# Patient Record
Sex: Female | Born: 1965 | Race: White | Hispanic: No | Marital: Married | Smoking: Never smoker
Health system: Southern US, Community
[De-identification: ages and names within clinical notes are randomized; demographics above are authoritative.]

## PROBLEM LIST (undated history)

## (undated) DIAGNOSIS — Z803 Family history of malignant neoplasm of breast: Secondary | ICD-10-CM

## (undated) DIAGNOSIS — Z800 Family history of malignant neoplasm of digestive organs: Secondary | ICD-10-CM

## (undated) DIAGNOSIS — R928 Other abnormal and inconclusive findings on diagnostic imaging of breast: Secondary | ICD-10-CM

## (undated) DIAGNOSIS — Z889 Allergy status to unspecified drugs, medicaments and biological substances status: Secondary | ICD-10-CM

## (undated) DIAGNOSIS — Z9289 Personal history of other medical treatment: Secondary | ICD-10-CM

## (undated) DIAGNOSIS — Z8052 Family history of malignant neoplasm of bladder: Secondary | ICD-10-CM

## (undated) DIAGNOSIS — N63 Unspecified lump in unspecified breast: Secondary | ICD-10-CM

## (undated) HISTORY — DX: Family history of malignant neoplasm of digestive organs: Z80.0

## (undated) HISTORY — DX: Allergy status to unspecified drugs, medicaments and biological substances: Z88.9

## (undated) HISTORY — PX: DILATION AND CURETTAGE OF UTERUS: SHX78

## (undated) HISTORY — DX: Personal history of other medical treatment: Z92.89

## (undated) HISTORY — DX: Unspecified lump in unspecified breast: N63.0

## (undated) HISTORY — PX: KNEE SURGERY: SHX244

## (undated) HISTORY — DX: Other abnormal and inconclusive findings on diagnostic imaging of breast: R92.8

## (undated) HISTORY — DX: Family history of malignant neoplasm of bladder: Z80.52

## (undated) HISTORY — DX: Family history of malignant neoplasm of breast: Z80.3

---

## 2005-04-07 ENCOUNTER — Ambulatory Visit: Payer: Self-pay | Admitting: Unknown Physician Specialty

## 2005-06-10 ENCOUNTER — Emergency Department: Payer: Self-pay | Admitting: Emergency Medicine

## 2007-11-30 ENCOUNTER — Ambulatory Visit: Payer: Self-pay | Admitting: Unknown Physician Specialty

## 2008-12-04 ENCOUNTER — Ambulatory Visit: Payer: Self-pay | Admitting: Unknown Physician Specialty

## 2008-12-12 ENCOUNTER — Ambulatory Visit: Payer: Self-pay | Admitting: Unknown Physician Specialty

## 2009-03-12 ENCOUNTER — Ambulatory Visit: Payer: Self-pay | Admitting: Internal Medicine

## 2009-06-19 ENCOUNTER — Ambulatory Visit: Payer: Self-pay | Admitting: General Surgery

## 2010-01-01 ENCOUNTER — Ambulatory Visit: Payer: Self-pay | Admitting: General Surgery

## 2010-10-17 DIAGNOSIS — R928 Other abnormal and inconclusive findings on diagnostic imaging of breast: Secondary | ICD-10-CM

## 2010-10-17 HISTORY — DX: Other abnormal and inconclusive findings on diagnostic imaging of breast: R92.8

## 2011-01-07 ENCOUNTER — Ambulatory Visit: Payer: Self-pay | Admitting: Unknown Physician Specialty

## 2014-11-20 ENCOUNTER — Ambulatory Visit: Payer: Self-pay | Admitting: Nurse Practitioner

## 2014-11-28 ENCOUNTER — Ambulatory Visit: Payer: Self-pay | Admitting: Nurse Practitioner

## 2016-02-01 DIAGNOSIS — Z9289 Personal history of other medical treatment: Secondary | ICD-10-CM

## 2016-02-01 HISTORY — DX: Personal history of other medical treatment: Z92.89

## 2017-02-14 ENCOUNTER — Telehealth: Payer: Self-pay | Admitting: Obstetrics and Gynecology

## 2017-02-14 NOTE — Telephone Encounter (Signed)
Pt is calling to schedule appt. Pt is concerned about abnormal bleeding. Pt is schedule with Dr. Jean Rosenthal at the first available appt. Pt would like to know if she could be worked in and also would like an Engineer, civil (consulting) to call her back. Please advise

## 2017-02-16 NOTE — Telephone Encounter (Signed)
Lm with pt to call back. First available spot is fine. If pt is very worried about bleeding she should go to ER.

## 2017-02-21 ENCOUNTER — Telehealth: Payer: Self-pay

## 2017-02-21 NOTE — Telephone Encounter (Signed)
Pt called over a week ago and hasn't heard from anyone.  She is have extended periods - over 10d- she was just spotting now it is dripping out of her.  Pt VERY upset.

## 2017-03-06 NOTE — Telephone Encounter (Signed)
Called pt this AM to check on her with bleeding, has appt 03/09/2017 with SDJ. Pt states bleeding has gotten better but I advised pt to keep appt so we can try to find out what is going on. Pt is due for annual but aware that we will only be able to address this issue at this appt then will get her annual scheduled at first available spot. Pt very appreciaitive and will be in 5/24

## 2017-03-09 ENCOUNTER — Encounter: Payer: Self-pay | Admitting: Obstetrics and Gynecology

## 2017-03-09 ENCOUNTER — Ambulatory Visit (INDEPENDENT_AMBULATORY_CARE_PROVIDER_SITE_OTHER): Payer: BLUE CROSS/BLUE SHIELD | Admitting: Obstetrics and Gynecology

## 2017-03-09 VITALS — BP 118/72 | Ht 65.0 in | Wt 168.0 lb

## 2017-03-09 DIAGNOSIS — N920 Excessive and frequent menstruation with regular cycle: Secondary | ICD-10-CM | POA: Diagnosis not present

## 2017-03-09 LAB — HEMOCCULT GUIAC POC 1CARD (OFFICE): Fecal Occult Blood, POC: NEGATIVE

## 2017-03-09 LAB — POCT URINE PREGNANCY: Preg Test, Ur: NEGATIVE

## 2017-03-09 NOTE — Progress Notes (Signed)
Obstetrics & Gynecology Office Visit   Chief Complaint  Patient presents with  . Menstrual Problem  . Abnormal Uterine Bleeding   History of Present Illness: Kelly Morgan is a 51 y.o. [id number] who LMP was Patient's last menstrual period was 02/09/2017., presents today for a problem visit.  She complains of menorrhagia that  began several weeks ago and its severity is described as severe.  She has regular periods every 28 days and they are associated with mild menstrual cramping.  She has used the following for attempts at control: panty liner. The patient is sexually active. She currently uses Female sterilization: Past: yes for contraception.   Previous evaluation: none. Prior Diagnosis: n/a. Previous Treatment: none.  LMP: Patient's last menstrual period was 02/09/2017. Menarche:14 Heavy Menses: no Clots: no Intermenstrual Bleeding: no Postcoital Bleeding: no Dysmenorrhea: yes  Review of Systems: Review of Systems  Constitutional: Negative.   HENT: Negative.   Eyes: Negative.   Respiratory: Negative.   Cardiovascular: Negative.   Gastrointestinal: Negative.   Genitourinary: Negative.   Musculoskeletal: Negative.   Skin: Negative.   Neurological: Negative.   Psychiatric/Behavioral: Negative.     Past Medical History:  Diagnosis Date  . Abnormal mammogram 2012  . Breast mass in female    left  . Family history of bladder cancer   . Family history of breast cancer   . Family history of colon cancer   . H/O seasonal allergies   . History of mammogram 02/01/2016   BIRAD 1  . History of Papanicolaou smear of cervix 02/01/2016   Neg    Past Surgical History:  Procedure Laterality Date  . DILATION AND CURETTAGE OF UTERUS    . KNEE SURGERY      Gynecologic History: Patient's last menstrual period was 02/09/2017.  Obstetric History: [id number]  Family History  Problem Relation Age of Onset  . Bladder Cancer Father   . Heart disease Father   . Stroke Father   .  Diabetes Mother   . Heart disease Mother   . Hypertension Mother   . Hypertension Brother   . Colon cancer Maternal Grandmother   . Heart disease Maternal Grandmother   . Heart disease Maternal Grandfather   . Heart disease Paternal Grandfather   . Breast cancer Maternal Aunt [id number]    Social History   Social History  . Marital status: Married    Spouse name: N/A  . Number of children: N/A  . Years of education: N/A   Occupational History  . Not on file.   Social History Main Topics  . Smoking status: Never Smoker  . Smokeless tobacco: Never Used  . Alcohol use Yes  . Drug use: No  . Sexual activity: Yes    Birth control/ protection: None   Other Topics Concern  . Not on file   Social History Narrative  . No narrative on file    Allergies  Allergen Reactions  . Tape   . Vicodin [Hydrocodone-Acetaminophen]     Medications:   Medication Sig Start Date End Date Taking? Authorizing Provider  loratadine (CLARITIN) 10 MG tablet Take 10 mg by mouth daily.   Yes [provider]  ergocalciferol (VITAMIN D2) 50000 units capsule Take 50,000 Units by mouth once a week.    [provider]  triamcinolone (NASACORT ALLERGY 24HR) 55 MCG/ACT AERO nasal inhaler Place 2 sprays into the nose daily.    [provider]    Physical Exam BP 118/72   Ht 5\' 5"  (1.651  m)   Wt 168 lb (76.2 kg)   LMP 02/09/2017   BMI 27.96 kg/m  Patient's last menstrual period was 02/09/2017. Physical Exam  Constitutional: She is oriented to person, place, and time and well-developed, well-nourished, and in no distress. No distress.  HENT:  Head: Normocephalic and atraumatic.  Eyes: Conjunctivae are normal. Left eye exhibits no discharge. No scleral icterus.  Neck: Normal range of motion. Neck supple. No thyromegaly present.  Cardiovascular: Normal rate and regular rhythm.  Exam reveals no gallop and no friction rub.   No murmur heard. Pulmonary/Chest: Effort normal and  breath sounds normal. No respiratory distress. She has no wheezes. She has no rales.  Abdominal: Soft. Bowel sounds are normal. She exhibits no distension and no mass. There is no tenderness. There is no rebound and no guarding.  Genitourinary: Vagina normal, uterus normal, cervix normal, right adnexa normal, left adnexa normal and vulva normal. Rectal exam shows no external hemorrhoid, no fissure, no mass and guaiac negative stool.  Musculoskeletal: Normal range of motion. She exhibits no edema.  Lymphadenopathy:    She has no cervical adenopathy.  Neurological: She is alert and oriented to person, place, and time. No cranial nerve deficit.  Skin: Skin is warm and dry. No rash noted.  Psychiatric: Judgment normal.    Female chaperone present for pelvic and breast  portions of the physical exam  Fecal occult blood test: negative Urine pregnancy test: negative  Assessment: 51 y.o. [id number] here for menorrhagia with regular cycle (abnormal uterine bleeding).  Plan: Discussed differential diagnosis, including a one-off due to environmental factors. Discussed menopausal transition. Discussed structural and non-structural potential etiologies.  Will Obtain pelvic ultrasound as starting point. Consider endometrial biopsy depending on findings on ultrasound and may perform further workup, in absence of structural explanation.    Thomasene Mohair, MD 03/09/2017 5:52 PM

## 2017-03-24 ENCOUNTER — Ambulatory Visit: Payer: BLUE CROSS/BLUE SHIELD | Admitting: Obstetrics and Gynecology

## 2017-03-24 ENCOUNTER — Other Ambulatory Visit: Payer: BLUE CROSS/BLUE SHIELD

## 2017-04-04 ENCOUNTER — Other Ambulatory Visit: Payer: BLUE CROSS/BLUE SHIELD

## 2017-04-04 ENCOUNTER — Ambulatory Visit: Payer: BLUE CROSS/BLUE SHIELD | Admitting: Obstetrics and Gynecology

## 2017-04-06 ENCOUNTER — Other Ambulatory Visit: Payer: Self-pay | Admitting: Obstetrics and Gynecology

## 2017-04-06 ENCOUNTER — Telehealth: Payer: Self-pay | Admitting: Obstetrics and Gynecology

## 2017-04-06 DIAGNOSIS — N920 Excessive and frequent menstruation with regular cycle: Secondary | ICD-10-CM

## 2017-04-06 NOTE — Telephone Encounter (Signed)
Pt is schedule 04/20/17. °

## 2017-04-06 NOTE — Telephone Encounter (Signed)
Pt is calling to be reschedule for TVUS . Pt appt has been cancelled twice the first time was due to no tech being in the off. Would you please sign off on new orders for TVUS so patient could have her ultrasound.

## 2017-04-20 ENCOUNTER — Encounter: Payer: Self-pay | Admitting: Obstetrics and Gynecology

## 2017-04-20 ENCOUNTER — Ambulatory Visit (INDEPENDENT_AMBULATORY_CARE_PROVIDER_SITE_OTHER): Payer: BLUE CROSS/BLUE SHIELD

## 2017-04-20 ENCOUNTER — Ambulatory Visit (INDEPENDENT_AMBULATORY_CARE_PROVIDER_SITE_OTHER): Payer: BLUE CROSS/BLUE SHIELD | Admitting: Obstetrics and Gynecology

## 2017-04-20 VITALS — BP 120/80 | HR 64 | Ht 65.0 in | Wt 172.0 lb

## 2017-04-20 DIAGNOSIS — N920 Excessive and frequent menstruation with regular cycle: Secondary | ICD-10-CM

## 2017-04-20 NOTE — Progress Notes (Signed)
   Gynecology Ultrasound Follow Up   Chief Complaint  Patient presents with  . Follow-up    past 2 periods have been normal   History of Present Illness: Patient is a 51 y.o. female who presents today for ultrasound evaluation of abnormal uterine bleeding .  Ultrasound demonstrates the following findgins Adnexa: no masses seen  Uterus: retroflexed with endometrial stripe  6.0 mm Additional: no other findings  Patient has had two normal menses since she saw me last.  No other complaints  Review of Systems: ROS - negative  Physical Exam BP 120/80   Pulse 64   Ht 5\' 5"  (1.651 m)   Wt 172 lb (78 kg)   LMP 04/05/2017   BMI 28.62 kg/m   General: NAD HEENT: normocephalic, anicteric Pulmonary: No increased work of breathing Extremities: no edema, erythema, or tenderness Neurologic: Grossly intact, normal gait Psychiatric: mood appropriate, affect full  Assessment: 51 y.o. [id number] menorrhagia with regular cycle.  Appears to have resolved.  Plan: Problem List Items Addressed This Visit    Menorrhagia with regular cycle - Primary     Follow up for routine annual examination in 1-2 months  Thomasene MohairStephen Mica Releford, MD 04/20/2017 5:40 PM

## 2018-06-01 ENCOUNTER — Other Ambulatory Visit (HOSPITAL_COMMUNITY)
Admission: RE | Admit: 2018-06-01 | Discharge: 2018-06-01 | Disposition: A | Discharge status: Home / Self Care | Payer: BLUE CROSS/BLUE SHIELD | Source: Ambulatory Visit | Attending: Obstetrics and Gynecology | Admitting: Obstetrics and Gynecology

## 2018-06-01 ENCOUNTER — Ambulatory Visit (INDEPENDENT_AMBULATORY_CARE_PROVIDER_SITE_OTHER): Payer: BLUE CROSS/BLUE SHIELD | Admitting: Obstetrics and Gynecology

## 2018-06-01 ENCOUNTER — Encounter: Payer: Self-pay | Admitting: Obstetrics and Gynecology

## 2018-06-01 VITALS — BP 118/80 | HR 65 | Ht 65.0 in | Wt 174.0 lb

## 2018-06-01 DIAGNOSIS — Z01419 Encounter for gynecological examination (general) (routine) without abnormal findings: Secondary | ICD-10-CM | POA: Insufficient documentation

## 2018-06-01 DIAGNOSIS — Z124 Encounter for screening for malignant neoplasm of cervix: Secondary | ICD-10-CM

## 2018-06-01 DIAGNOSIS — Z1321 Encounter for screening for nutritional disorder: Secondary | ICD-10-CM

## 2018-06-01 DIAGNOSIS — Z1339 Encounter for screening examination for other mental health and behavioral disorders: Secondary | ICD-10-CM

## 2018-06-01 DIAGNOSIS — Z131 Encounter for screening for diabetes mellitus: Secondary | ICD-10-CM

## 2018-06-01 DIAGNOSIS — Z1329 Encounter for screening for other suspected endocrine disorder: Secondary | ICD-10-CM

## 2018-06-01 DIAGNOSIS — Z1331 Encounter for screening for depression: Secondary | ICD-10-CM | POA: Diagnosis not present

## 2018-06-01 DIAGNOSIS — Z0000 Encounter for general adult medical examination without abnormal findings: Secondary | ICD-10-CM

## 2018-06-01 DIAGNOSIS — Z13220 Encounter for screening for lipoid disorders: Secondary | ICD-10-CM

## 2018-06-01 LAB — HM PAP SMEAR: HM Pap smear: NORMAL

## 2018-06-01 NOTE — Progress Notes (Signed)
Ggynecology Annual Exam  PCP: Patient, No Pcp Per  Chief Complaint  Patient presents with  . Annual Exam   History of Present Illness:  Ms. Kelly Morgan is a 52 y.o. [id number] who LMP was Patient's last menstrual period was 04/07/2018., presents today for her annual examination.  Her menses have been regular since her last visit. However, she has not had a menses in about 2 months.    She does have vasomotor symptoms. She has hot flushes. She has gained some weight.   She is single partner, contraception - vasectomy. She does not have vaginal dryness. Does not have pain with intercourse.   Last Pap: 2 years (plain pap)  Results were: no abnormalities, HPV DNA.not done Hx of STDs: distant history of genital warts.  Last mammogram: 2 years  Results were: normal--routine follow-up in 12 months There is a FH of breast cancer in her maternal aunt. There is no FH of ovarian cancer. The patient does not do self-breast exams.  Colonoscopy: not yet due.  DEXA: has not been screened for osteoporosis  Tobacco use: The patient denies current or previous tobacco use. Alcohol use: social drinker Exercise: mild exercise  The patient wears seatbelts: yes.   She feels safe at home without concerns for abuse.  Past Medical History:  Diagnosis Date  . Abnormal mammogram 2012  . Breast mass in female    left  . Family history of bladder cancer   . Family history of breast cancer   . Family history of colon cancer   . H/O seasonal allergies   . History of mammogram 02/01/2016   BIRAD 1  . History of Papanicolaou smear of cervix 02/01/2016   Neg    Past Surgical History:  Procedure Laterality Date  . DILATION AND CURETTAGE OF UTERUS    . KNEE SURGERY      Prior to Admission medications   Medication Sig Start Date End Date Taking? Authorizing Provider  loratadine (CLARITIN) 10 MG tablet Take 10 mg by mouth daily.   Yes [provider]  triamcinolone (NASACORT ALLERGY  24HR) 55 MCG/ACT AERO nasal inhaler Place 2 sprays into the nose as needed.    Yes [provider]  ergocalciferol (VITAMIN D2) 50000 units capsule Take 50,000 Units by mouth once a week.    [provider]   Allergies  Allergen Reactions  . Tape   . Vicodin [Hydrocodone-Acetaminophen]    Obstetric History: [id number]  Family History  Problem Relation Age of Onset  . Bladder Cancer Father   . Heart disease Father   . Stroke Father   . Diabetes Mother   . Heart disease Mother   . Hypertension Mother   . Hypertension Brother   . Colon cancer Maternal Grandmother   . Heart disease Maternal Grandmother   . Heart disease Maternal Grandfather   . Heart disease Paternal Grandfather   . Breast cancer Maternal Aunt [id number]    Social History   Socioeconomic History  . Marital status: Married    Spouse name: Not on file  . Number of children: Not on file  . Years of education: Not on file  . Highest education level: Not on file  Occupational History  . Not on file  Social Needs  . Financial resource strain: Not on file  . Food insecurity:    Worry: Not on file    Inability: Not on file  . Transportation needs:    Medical: Not on file  Non-medical: Not on file  Tobacco Use  . Smoking status: Never Smoker  . Smokeless tobacco: Never Used  Substance and Sexual Activity  . Alcohol use: Yes  . Drug use: No  . Sexual activity: Yes    Birth control/protection: None  Lifestyle  . Physical activity:    Days per week: Not on file    Minutes per session: Not on file  . Stress: Not on file  Relationships  . Social connections:    Talks on phone: Not on file    Gets together: Not on file    Attends religious service: Not on file    Active member of club or organization: Not on file    Attends meetings of clubs or organizations: Not on file    Relationship status: Not on file  . Intimate partner violence:    Fear of current or ex partner: Not on file     Emotionally abused: Not on file    Physically abused: Not on file    Forced sexual activity: Not on file  Other Topics Concern  . Not on file  Social History Narrative  . Not on file    Review of Systems  Constitutional: Negative.   HENT: Negative.   Eyes: Negative.   Respiratory: Negative.   Cardiovascular: Negative.   Gastrointestinal: Negative.   Genitourinary: Negative.   Musculoskeletal: Negative.   Skin: Negative.   Neurological: Negative.   Psychiatric/Behavioral: Negative.      Physical Exam BP 118/80 (BP Location: Left Arm, Patient Position: Sitting, Cuff Size: Normal)   Pulse 65   Ht 5\' 5"  (1.651 m)   Wt 174 lb (78.9 kg)   LMP 04/07/2018   SpO2 99%   BMI 28.96 kg/m   Physical Exam  Constitutional: She is oriented to person, place, and time. She appears well-developed and well-nourished. No distress.  Genitourinary: Uterus normal. Pelvic exam was performed with patient supine. There is no rash, tenderness, lesion or injury on the right labia. There is no rash, tenderness, lesion or injury on the left labia. No erythema, tenderness or bleeding in the vagina. No signs of injury around the vagina. No vaginal discharge found. Right adnexum does not display mass, does not display tenderness and does not display fullness. Left adnexum does not display mass, does not display tenderness and does not display fullness. Cervix does not exhibit motion tenderness, lesion, discharge or polyp.   Uterus is mobile and anteverted. Uterus is not enlarged, tender or exhibiting a mass.  HENT:  Head: Normocephalic and atraumatic.  Eyes: EOM are normal. No scleral icterus.  Neck: Normal range of motion. Neck supple. No thyromegaly present.  Cardiovascular: Normal rate and regular rhythm. Exam reveals no gallop and no friction rub.  No murmur heard. Pulmonary/Chest: Effort normal and breath sounds normal. No respiratory distress. She has no wheezes. She has no rales. Right breast exhibits  no inverted nipple, no mass, no nipple discharge, no skin change and no tenderness. Left breast exhibits no inverted nipple, no mass, no nipple discharge, no skin change and no tenderness.  Abdominal: Soft. Bowel sounds are normal. She exhibits no distension and no mass. There is no tenderness. There is no rebound and no guarding.  Musculoskeletal: Normal range of motion. She exhibits no edema or tenderness.  Lymphadenopathy:    She has no cervical adenopathy.       Right: No inguinal adenopathy present.       Left: No inguinal adenopathy present.  Neurological: She  is alert and oriented to person, place, and time. No cranial nerve deficit.  Skin: Skin is warm and dry. No rash noted. No erythema.  Psychiatric: She has a normal mood and affect. Her behavior is normal. Judgment normal.    Female chaperone present for pelvic and breast  portions of the physical exam  Results: AUDIT Questionnaire (screen for alcoholism): 5  Depression screen [facility] 2/9 06/01/2018  Decreased Interest 0  Down, Depressed, Hopeless 0  PHQ - 2 Score 0  Altered sleeping 1  Tired, decreased energy 1  Change in appetite 0  Feeling bad or failure about yourself  1  Trouble concentrating 0  Moving slowly or fidgety/restless 0  Suicidal thoughts 0  PHQ-9 Score 3  Difficult doing work/chores Not difficult at all   Assessment: 52 y.o. [id number] female here for routine gynecologic examination.  Plan: Problem List Items Addressed This Visit    None    Visit Diagnoses    Women's annual routine gynecological examination    -  Primary   Relevant Orders   Cytology - PAP   Hgb A1c w/o eAG   TSH + free T4   VITAMIN D 25 Hydroxy (Vit-D Deficiency, Fractures)   Lipid Panel With LDL/HDL Ratio   CBC   Comprehensive metabolic panel   Screening for depression       Screening for alcoholism       Pap smear for cervical cancer screening       Relevant Orders   Cytology - PAP   Screening cholesterol level       Relevant  Orders   Lipid Panel With LDL/HDL Ratio   Screening for thyroid disorder       Relevant Orders   TSH + free T4   Encounter for vitamin deficiency screening       Relevant Orders   VITAMIN D 25 Hydroxy (Vit-D Deficiency, Fractures)   Screening for diabetes mellitus       Relevant Orders   Hgb A1c w/o eAG   Laboratory examination ordered as part of a routine general medical examination       Relevant Orders   Hgb A1c w/o eAG   TSH + free T4   VITAMIN D 25 Hydroxy (Vit-D Deficiency, Fractures)   Lipid Panel With LDL/HDL Ratio   CBC   Comprehensive metabolic panel      Screening: -- Blood pressure screen normal -- Colonoscopy - not due -- Mammogram - due. Patient to call [place] to arrange. She understands that it is her responsibility to arrange this. -- Weight screening: normal -- Depression screening negative (PHQ-9) -- Nutrition: normal -- cholesterol screening: will obtain -- osteoporosis screening: not due -- tobacco screening: not using -- alcohol screening: AUDIT questionnaire indicates low-risk usage. -- family history of breast cancer screening: done. not at high risk. -- no evidence of domestic violence or intimate partner violence. -- STD screening: gonorrhea/chlamydia NAAT not collected per patient request. -- pap smear collected per ASCCP guidelines  Thomasene Mohair, MD 06/01/2018 1:35 PM

## 2018-06-02 LAB — LIPID PANEL WITH LDL/HDL RATIO
Cholesterol, Total: 243 mg/dL — ABNORMAL HIGH (ref 100–199)
HDL: 48 mg/dL (ref >39)
LDL Calculated: 161 mg/dL — ABNORMAL HIGH (ref 0–99)
LDl/HDL Ratio: 3.4 ratio — ABNORMAL HIGH (ref 0.0–3.2)
Triglycerides: 172 mg/dL — ABNORMAL HIGH (ref 0–149)
VLDL Cholesterol Cal: 34 mg/dL (ref 5–40)

## 2018-06-02 LAB — COMPREHENSIVE METABOLIC PANEL
ALT: 16 IU/L (ref 0–32)
AST: 20 IU/L (ref 0–40)
Albumin/Globulin Ratio: 1.4 (ref 1.2–2.2)
Albumin: 4.4 g/dL (ref 3.5–5.5)
Alkaline Phosphatase: 61 IU/L (ref 39–117)
BUN/Creatinine Ratio: 11 (ref 9–23)
BUN: 9 mg/dL (ref 6–24)
Bilirubin Total: 0.5 mg/dL (ref 0.0–1.2)
CO2: 20 mmol/L (ref 20–29)
Calcium: 9.3 mg/dL (ref 8.7–10.2)
Chloride: 101 mmol/L (ref 96–106)
Creatinine, Ser: 0.85 mg/dL (ref 0.57–1.00)
GFR calc Af Amer: 93 mL/min/{1.73_m2} (ref >59)
GFR calc non Af Amer: 81 mL/min/{1.73_m2} (ref >59)
Globulin, Total: 3.2 g/dL (ref 1.5–4.5)
Glucose: 84 mg/dL (ref 65–99)
Potassium: 4.5 mmol/L (ref 3.5–5.2)
Sodium: 137 mmol/L (ref 134–144)
Total Protein: 7.6 g/dL (ref 6.0–8.5)

## 2018-06-02 LAB — CBC
Hematocrit: 42.1 % (ref 34.0–46.6)
Hemoglobin: 14.3 g/dL (ref 11.1–15.9)
MCH: 29.9 pg (ref 26.6–33.0)
MCHC: 34 g/dL (ref 31.5–35.7)
MCV: 88 fL (ref 79–97)
Platelets: 321 10*3/uL (ref 150–450)
RBC: 4.79 x10E6/uL (ref 3.77–5.28)
RDW: 14.2 % (ref 12.3–15.4)
WBC: 8.8 10*3/uL (ref 3.4–10.8)

## 2018-06-02 LAB — HGB A1C W/O EAG: Hgb A1c MFr Bld: 5.3 % (ref 4.8–5.6)

## 2018-06-02 LAB — TSH+FREE T4
Free T4: 0.94 ng/dL (ref 0.82–1.77)
TSH: 2.29 u[IU]/mL (ref 0.450–4.500)

## 2018-06-02 LAB — VITAMIN D 25 HYDROXY (VIT D DEFICIENCY, FRACTURES): Vit D, 25-Hydroxy: 25.3 ng/mL — ABNORMAL LOW (ref 30.0–100.0)

## 2018-06-05 LAB — CYTOLOGY - PAP
Diagnosis: NEGATIVE
HPV: NOT DETECTED

## 2018-08-13 ENCOUNTER — Other Ambulatory Visit: Payer: Self-pay | Admitting: Obstetrics and Gynecology

## 2018-08-13 DIAGNOSIS — Z1231 Encounter for screening mammogram for malignant neoplasm of breast: Secondary | ICD-10-CM

## 2018-08-15 ENCOUNTER — Ambulatory Visit
Admission: RE | Admit: 2018-08-15 | Discharge: 2018-08-15 | Disposition: A | Discharge status: Home / Self Care | Payer: BLUE CROSS/BLUE SHIELD | Source: Ambulatory Visit | Attending: Obstetrics and Gynecology | Admitting: Obstetrics and Gynecology

## 2018-08-15 ENCOUNTER — Encounter: Payer: Self-pay | Admitting: Radiology

## 2018-08-15 DIAGNOSIS — Z1231 Encounter for screening mammogram for malignant neoplasm of breast: Secondary | ICD-10-CM | POA: Diagnosis present

## 2019-07-16 ENCOUNTER — Other Ambulatory Visit: Payer: Self-pay

## 2019-07-16 DIAGNOSIS — Z20822 Contact with and (suspected) exposure to covid-19: Secondary | ICD-10-CM

## 2019-07-17 LAB — NOVEL CORONAVIRUS, NAA: SARS-CoV-2, NAA: NOT DETECTED

## 2019-08-02 ENCOUNTER — Other Ambulatory Visit: Payer: Self-pay

## 2019-08-02 ENCOUNTER — Ambulatory Visit (INDEPENDENT_AMBULATORY_CARE_PROVIDER_SITE_OTHER): Payer: BLUE CROSS/BLUE SHIELD | Admitting: Obstetrics and Gynecology

## 2019-08-02 ENCOUNTER — Encounter: Payer: Self-pay | Admitting: Obstetrics and Gynecology

## 2019-08-02 VITALS — BP 118/78 | Ht 65.0 in | Wt 176.0 lb

## 2019-08-02 DIAGNOSIS — Z01419 Encounter for gynecological examination (general) (routine) without abnormal findings: Secondary | ICD-10-CM

## 2019-08-02 DIAGNOSIS — Z1329 Encounter for screening for other suspected endocrine disorder: Secondary | ICD-10-CM

## 2019-08-02 DIAGNOSIS — Z1331 Encounter for screening for depression: Secondary | ICD-10-CM

## 2019-08-02 DIAGNOSIS — Z1339 Encounter for screening examination for other mental health and behavioral disorders: Secondary | ICD-10-CM

## 2019-08-02 DIAGNOSIS — Z131 Encounter for screening for diabetes mellitus: Secondary | ICD-10-CM

## 2019-08-02 DIAGNOSIS — Z0000 Encounter for general adult medical examination without abnormal findings: Secondary | ICD-10-CM

## 2019-08-02 DIAGNOSIS — Z1211 Encounter for screening for malignant neoplasm of colon: Secondary | ICD-10-CM

## 2019-08-02 DIAGNOSIS — Z13220 Encounter for screening for lipoid disorders: Secondary | ICD-10-CM

## 2019-08-02 NOTE — Progress Notes (Signed)
Routine Annual Gynecology Examination   PCP: Patient, No Pcp Per  Chief Complaint  Patient presents with  . Annual Exam   History of Present Illness: Patient is a 53 y.o. [id number] presents for annual exam. The patient has no complaints today.   Menses: she went a couple of times where she had no period for three months. Now she is having them monthly.  She states they aren't bothersome. She has 1-2 days of heavy bleeding, then they slow down.    She has no issues with intercourse. Her husband has had a vasectomy.   Menopausal symptoms: She does have hot flashes.   Breast symptoms: denies. She has tried a mammogram, but was unable to be scheduled due to not being seen.   Last pap smear: 1 years ago.  Result Normal, HPV negative History of STDs: Distant history of genital warts.   Last mammogram: 1 year ago.  Result Normal  There is a FH of breast cancer in her maternal aunt. There is no FH of ovarian cancer. The patient does not do self-breast exams.  Last Colonoscopy:  She has never had.    Past Medical History:  Diagnosis Date  . Abnormal mammogram 2012  . Breast mass in female    left  . Family history of bladder cancer   . Family history of breast cancer   . Family history of colon cancer   . H/O seasonal allergies   . History of mammogram 02/01/2016   BIRAD 1  . History of Papanicolaou smear of cervix 02/01/2016   Neg    Past Surgical History:  Procedure Laterality Date  . DILATION AND CURETTAGE OF UTERUS    . KNEE SURGERY     Prior to Admission medications   Medication Sig Start Date End Date Taking? Authorizing Provider  ergocalciferol (VITAMIN D2) 50000 units capsule Take 50,000 Units by mouth once a week.   Yes [provider]  loratadine (CLARITIN) 10 MG tablet Take 10 mg by mouth daily.   Yes [provider]    Allergies  Allergen Reactions  . Tape   . Vicodin [Hydrocodone-Acetaminophen]    Obstetric History: [id number]  Social  History   Socioeconomic History  . Marital status: Married    Spouse name: Not on file  . Number of children: Not on file  . Years of education: Not on file  . Highest education level: Not on file  Occupational History  . Not on file  Social Needs  . Financial resource strain: Not on file  . Food insecurity    Worry: Not on file    Inability: Not on file  . Transportation needs    Medical: Not on file    Non-medical: Not on file  Tobacco Use  . Smoking status: Never Smoker  . Smokeless tobacco: Never Used  Substance and Sexual Activity  . Alcohol use: Yes  . Drug use: No  . Sexual activity: Yes    Birth control/protection: None  Lifestyle  . Physical activity    Days per week: Not on file    Minutes per session: Not on file  . Stress: Not on file  Relationships  . Social Herbalist on phone: Not on file    Gets together: Not on file    Attends religious service: Not on file    Active member of club or organization: Not on file    Attends meetings of clubs or organizations: Not on file  Relationship status: Not on file  . Intimate partner violence    Fear of current or ex partner: Not on file    Emotionally abused: Not on file    Physically abused: Not on file    Forced sexual activity: Not on file  Other Topics Concern  . Not on file  Social History Narrative  . Not on file    Family History  Problem Relation Age of Onset  . Bladder Cancer Father   . Heart disease Father   . Stroke Father   . Diabetes Mother   . Heart disease Mother   . Hypertension Mother   . Hypertension Brother   . Colon cancer Maternal Grandmother   . Heart disease Maternal Grandmother   . Heart disease Maternal Grandfather   . Heart disease Paternal Grandfather   . Breast cancer Maternal Aunt 58    Review of Systems  Constitutional: Negative.   HENT: Negative.   Eyes: Negative.   Respiratory: Negative.   Cardiovascular: Negative.   Gastrointestinal: Negative.    Genitourinary: Negative.   Musculoskeletal: Negative.   Skin: Negative.   Neurological: Negative.   Psychiatric/Behavioral: Negative.    Physical Exam Vitals: BP 118/78   Ht 5\' 5"  (1.651 m)   Wt 176 lb (79.8 kg)   BMI 29.29 kg/m   Physical Exam Constitutional:      General: She is not in acute distress.    Appearance: Normal appearance. She is well-developed.  Genitourinary:     Pelvic exam was performed with patient supine.     Vulva, urethra, bladder and uterus normal.     No inguinal adenopathy present in the right or left side.    No signs of injury in the vagina.     No vaginal discharge, erythema, tenderness or bleeding.     No cervical motion tenderness, discharge, lesion or polyp.     Uterus is mobile.     Uterus is not enlarged or tender.     No uterine mass detected.    Uterus is anteverted.     No right or left adnexal mass present.     Right adnexa not tender or full.     Left adnexa not tender or full.  HENT:     Head: Normocephalic and atraumatic.  Eyes:     General: No scleral icterus.    Conjunctiva/sclera: Conjunctivae normal.  Neck:     Musculoskeletal: Normal range of motion and neck supple.     Thyroid: No thyromegaly.  Cardiovascular:     Rate and Rhythm: Normal rate and regular rhythm.     Heart sounds: No murmur. No friction rub. No gallop.   Pulmonary:     Effort: Pulmonary effort is normal. No respiratory distress.     Breath sounds: Normal breath sounds. No wheezing or rales.  Chest:     Breasts:        Right: No inverted nipple, mass, nipple discharge, skin change or tenderness.        Left: No inverted nipple, mass, nipple discharge, skin change or tenderness.  Abdominal:     General: Bowel sounds are normal. There is no distension.     Palpations: Abdomen is soft. There is no mass.     Tenderness: There is no abdominal tenderness. There is no guarding or rebound.  Musculoskeletal: Normal range of motion.        General: No swelling or  tenderness.  Lymphadenopathy:     Cervical: No cervical  adenopathy.     Lower Body: No right inguinal adenopathy. No left inguinal adenopathy.  Neurological:     General: No focal deficit present.     Mental Status: She is alert and oriented to person, place, and time.     Cranial Nerves: No cranial nerve deficit.  Skin:    General: Skin is warm and dry.     Findings: No erythema or rash.  Psychiatric:        Mood and Affect: Mood normal.        Behavior: Behavior normal.        Judgment: Judgment normal.      Female chaperone present for pelvic and breast  portions of the physical exam  Results: AUDIT Questionnaire (screen for alcoholism): 5 PHQ-9: 4   Assessment and Plan:  53 y.o. [id number] female here for routine annual gynecologic examination  Plan: Problem List Items Addressed This Visit    None    Visit Diagnoses    Women's annual routine gynecological examination    -  Primary   Relevant Orders   Ambulatory referral to Gastroenterology   Hgb A1c w/o eAG   TSH   Lipid Panel With LDL/HDL Ratio   CBC   Comprehensive metabolic panel   Screening for depression       Screening for alcoholism       Screen for colon cancer       Relevant Orders   Ambulatory referral to Gastroenterology   Blood tests for routine general physical examination       Relevant Orders   CBC   Comprehensive metabolic panel   Screening cholesterol level       Relevant Orders   Lipid Panel With LDL/HDL Ratio   Screening for thyroid disorder       Relevant Orders   TSH   Screening for diabetes mellitus       Relevant Orders   Hgb A1c w/o eAG      Screening: -- Blood pressure screen normal -- Colonoscopy - due - will schedule -- Mammogram - due. Patient to call [facility] to arrange. She understands that it is her responsibility to arrange this. -- Weight screening: normal -- Depression screening negative (PHQ-9) -- Nutrition: normal -- cholesterol screening: per PCP --  osteoporosis screening: not due -- tobacco screening: not using -- alcohol screening: AUDIT questionnaire indicates low-risk usage. -- family history of breast cancer screening: done. not at high risk. -- no evidence of domestic violence or intimate partner violence. -- STD screening: gonorrhea/chlamydia NAAT not collected per patient request. -- pap smear not collected per ASCCP guidelines -- flu vaccine received 2 days ago -- HPV vaccination series: not eligilbe    Thomasene MohairStephen Chalsey Leeth, MD 08/02/2019 2:12 PM

## 2019-08-03 LAB — COMPREHENSIVE METABOLIC PANEL
ALT: 16 IU/L (ref 0–32)
AST: 21 IU/L (ref 0–40)
Albumin/Globulin Ratio: 1.6 (ref 1.2–2.2)
Albumin: 4.1 g/dL (ref 3.8–4.8)
Alkaline Phosphatase: 57 IU/L (ref 39–117)
BUN/Creatinine Ratio: 12 (ref 9–23)
BUN: 11 mg/dL (ref 6–24)
Bilirubin Total: 0.3 mg/dL (ref 0.0–1.2)
CO2: 24 mmol/L (ref 20–29)
Calcium: 9.5 mg/dL (ref 8.7–10.2)
Chloride: 100 mmol/L (ref 96–106)
Creatinine, Ser: 0.91 mg/dL (ref 0.57–1.00)
GFR calc Af Amer: 85 mL/min/{1.73_m2} (ref >59)
GFR calc non Af Amer: 74 mL/min/{1.73_m2} (ref >59)
Globulin, Total: 2.6 g/dL (ref 1.5–4.5)
Glucose: 67 mg/dL (ref 65–99)
Potassium: 4.1 mmol/L (ref 3.5–5.2)
Sodium: 138 mmol/L (ref 134–144)
Total Protein: 6.7 g/dL (ref 6.0–8.5)

## 2019-08-03 LAB — CBC
Hematocrit: 40.4 % (ref 34.0–46.6)
Hemoglobin: 13.8 g/dL (ref 11.1–15.9)
MCH: 29.5 pg (ref 26.6–33.0)
MCHC: 34.2 g/dL (ref 31.5–35.7)
MCV: 86 fL (ref 79–97)
Platelets: 296 10*3/uL (ref 150–450)
RBC: 4.68 x10E6/uL (ref 3.77–5.28)
RDW: 13 % (ref 11.7–15.4)
WBC: 9.5 10*3/uL (ref 3.4–10.8)

## 2019-08-03 LAB — LIPID PANEL WITH LDL/HDL RATIO
Cholesterol, Total: 245 mg/dL — ABNORMAL HIGH (ref 100–199)
HDL: 46 mg/dL (ref >39)
LDL Chol Calc (NIH): 164 mg/dL — ABNORMAL HIGH (ref 0–99)
LDL/HDL Ratio: 3.6 ratio — ABNORMAL HIGH (ref 0.0–3.2)
Triglycerides: 188 mg/dL — ABNORMAL HIGH (ref 0–149)
VLDL Cholesterol Cal: 35 mg/dL (ref 5–40)

## 2019-08-03 LAB — HGB A1C W/O EAG: Hgb A1c MFr Bld: 5.4 % (ref 4.8–5.6)

## 2019-08-03 LAB — TSH: TSH: 2.07 u[IU]/mL (ref 0.450–4.500)

## 2019-08-12 ENCOUNTER — Other Ambulatory Visit: Payer: Self-pay

## 2019-08-12 ENCOUNTER — Telehealth: Payer: Self-pay

## 2019-08-12 DIAGNOSIS — Z1211 Encounter for screening for malignant neoplasm of colon: Secondary | ICD-10-CM

## 2019-08-12 NOTE — Telephone Encounter (Signed)
Gastroenterology Pre-Procedure Review  Request Date: Friday 11/20 Requesting Physician: Dr. Allen Norris  PATIENT REVIEW QUESTIONS: The patient responded to the following health history questions as indicated:    1. Are you having any GI issues? no 2. Do you have a personal history of Polyps? no 3. Do you have a family history of Colon Cancer or Polyps? yes (maternal grandmother colon cancer) 4. Diabetes Mellitus? no 5. Joint replacements in the past 12 months?no 6. Major health problems in the past 3 months?no 7. Any artificial heart valves, MVP, or defibrillator?no    MEDICATIONS & ALLERGIES:    Patient reports the following regarding taking any anticoagulation/antiplatelet therapy:   Plavix, Coumadin, Eliquis, Xarelto, Lovenox, Pradaxa, Brilinta, or Effient? no Aspirin? no  Patient confirms/reports the following medications:  Current Outpatient Medications  Medication Sig Dispense Refill  . ergocalciferol (VITAMIN D2) 50000 units capsule Take 50,000 Units by mouth once a week.    . loratadine (CLARITIN) 10 MG tablet Take 10 mg by mouth daily.     No current facility-administered medications for this visit.     Patient confirms/reports the following allergies:  Allergies  Allergen Reactions  . Tape   . Vicodin [Hydrocodone-Acetaminophen]     No orders of the defined types were placed in this encounter.   AUTHORIZATION INFORMATION Primary Insurance: 1D#: Group #:  Secondary Insurance: 1D#: Group #:  SCHEDULE INFORMATION: Date: 09/06/19 Time: Location:[facility]

## 2019-08-15 ENCOUNTER — Other Ambulatory Visit: Payer: Self-pay | Admitting: Obstetrics and Gynecology

## 2019-08-15 DIAGNOSIS — Z1231 Encounter for screening mammogram for malignant neoplasm of breast: Secondary | ICD-10-CM

## 2019-08-19 ENCOUNTER — Telehealth: Payer: Self-pay

## 2019-08-19 NOTE — Telephone Encounter (Signed)
Pt calling requesting a new referral for colonoscopy.  [facility] is out of her network.  Would like to have it with Dr. Bruce Donath c [facility] [place].  She is trying to get this done before the end of the year.  [phone]

## 2019-08-28 ENCOUNTER — Other Ambulatory Visit: Payer: Self-pay | Admitting: Obstetrics and Gynecology

## 2019-08-28 DIAGNOSIS — Z01419 Encounter for gynecological examination (general) (routine) without abnormal findings: Secondary | ICD-10-CM

## 2019-08-28 DIAGNOSIS — Z1211 Encounter for screening for malignant neoplasm of colon: Secondary | ICD-10-CM

## 2019-08-28 NOTE — Telephone Encounter (Signed)
New referral placed to Dr. Alice Reichert.

## 2019-08-28 NOTE — Telephone Encounter (Signed)
Referral faxed. [facility] to contact the patient directly for scheduling.

## 2019-08-29 ENCOUNTER — Ambulatory Visit
Admission: RE | Admit: 2019-08-29 | Discharge: 2019-08-29 | Disposition: A | Discharge status: Home / Self Care | Payer: BLUE CROSS/BLUE SHIELD | Source: Ambulatory Visit | Attending: Obstetrics and Gynecology | Admitting: Obstetrics and Gynecology

## 2019-08-29 ENCOUNTER — Other Ambulatory Visit: Payer: Self-pay

## 2019-08-29 DIAGNOSIS — Z1231 Encounter for screening mammogram for malignant neoplasm of breast: Secondary | ICD-10-CM | POA: Diagnosis present

## 2019-09-06 ENCOUNTER — Ambulatory Visit: Admit: 2019-09-06 | Payer: BLUE CROSS/BLUE SHIELD | Admitting: Gastroenterology

## 2019-09-06 SURGERY — COLONOSCOPY WITH PROPOFOL
Anesthesia: Choice

## 2019-11-14 ENCOUNTER — Other Ambulatory Visit: Payer: Self-pay

## 2019-11-14 ENCOUNTER — Ambulatory Visit
Admission: EM | Admit: 2019-11-14 | Discharge: 2019-11-14 | Disposition: A | Discharge status: Home / Self Care | Payer: BLUE CROSS/BLUE SHIELD | Attending: Urgent Care | Admitting: Urgent Care

## 2019-11-14 ENCOUNTER — Encounter: Payer: Self-pay | Admitting: Emergency Medicine

## 2019-11-14 DIAGNOSIS — J069 Acute upper respiratory infection, unspecified: Secondary | ICD-10-CM | POA: Diagnosis present

## 2019-11-14 LAB — INFLUENZA PANEL BY PCR (TYPE A & B)
Influenza A By PCR: NEGATIVE
Influenza B By PCR: NEGATIVE

## 2019-11-14 NOTE — ED Triage Notes (Signed)
Patient received her first covid vaccine on Saturday (11/09/19).

## 2019-11-14 NOTE — ED Triage Notes (Signed)
Patient in today c/o sore throat, cough, body aches, fatigue x 9 days, but getting worse for the last 4 days. Patient's covid test on 11/12/19 was negative. Patient saw ENT this morning and no significant findings on exam. ENT recommended patient come to Urgent Care for flu test.

## 2019-11-14 NOTE — Discharge Instructions (Signed)
It was very nice seeing you today in clinic. Thank you for entrusting me with your care.   Rest and Stay HYDRATED. Water and electrolyte containing beverages (Gatorade, Pedialyte) are best to prevent dehydration and electrolyte abnormalities. Use antibiotic as prescribed by ENT.  Make arrangements to follow up with your regular doctor in 1 week for re-evaluation if not improving. If your symptoms/condition worsens, please seek follow up care either here or in the ER. Please remember, our [facility] providers are "right here with you" when you need [place].   Again, it was my pleasure to take care of you today. Thank you for choosing our clinic. I hope that you start to feel better quickly.   Quentin Mulling, MSN, APRN, FNP-C, CEN Advanced Practice Provider [facility] [place] [facility]

## 2019-11-14 NOTE — ED Triage Notes (Signed)
Patient's strep test was negative this morning at ENT, but was prescribed Augmentin.

## 2019-11-15 NOTE — ED Provider Notes (Signed)
[place], [place]   Name: Kelly Morgan DOB: [birth year] MRN: [id number] CSN: [id number] PCP: Patient, No Pcp Per  Arrival date and time:  11/14/19 1014  Chief Complaint:  Cough, Sore Throat, and Generalized Body Aches   NOTE: Prior to seeing the patient today, I have reviewed the triage nursing documentation and vital signs. Clinical staff has updated patient's PMH/PSHx, current medication list, and drug allergies/intolerances to ensure comprehensive history available to assist in medical decision making.   History:   HPI: Kelly Morgan is a 54 y.o. female who presents today with complaints of fatigue, cough, congestion, sore throat, chills, and diffuse myalgias that started approximately 9 days ago. Symptoms worsening over the course of the last 4 days.Cough has been non-productive with no associated shortness of breath or wheezing. She has had some mild nausea. Patient denies that she has experienced any vomiting, diarrhea, or abdominal pain. She is eating and drinking well. Patient denies any perceived alterations to her sense of taste or smell. Patient presents out of concerns for her personal health due to the worsening of her symptoms. Despite not feeling well, patient received initial SARS-CoV-2 (novel coronavirus) vaccination on 11/09/2019. Patient was tested for SARS-CoV-2 on 11/12/2019 and the results were negative. Patient seen by ENT this morning where she had negative testing for streptococcal pharyngitis. Patient provided with a prescription for empiric antimicrobial coverage (amoxicillin-clavulanate) and was sent to urgent care to be tested for influenza. Patient denies being in close contact with anyone known to be ill; no one else is her home has experienced a similar symptom constellation. Patient has not been vaccinated for influenza this season. In efforts to conservatively manage her symptoms at home, the patient notes that she has used IBU, which has not  helped to improve her symptoms.    Past Medical History:  Diagnosis Date  . Abnormal mammogram 2012  . Breast mass in female    left  . Family history of bladder cancer   . Family history of breast cancer   . Family history of colon cancer   . H/O seasonal allergies   . History of mammogram 02/01/2016   BIRAD 1  . History of Papanicolaou smear of cervix 02/01/2016   Neg    Past Surgical History:  Procedure Laterality Date  . DILATION AND CURETTAGE OF UTERUS    . KNEE SURGERY      Family History  Problem Relation Age of Onset  . Bladder Cancer Father   . Heart disease Father   . Stroke Father   . Diabetes Mother   . Heart disease Mother   . Hypertension Mother   . Hypertension Brother   . Colon cancer Maternal Grandmother   . Heart disease Maternal Grandmother   . Heart disease Maternal Grandfather   . Heart disease Paternal Grandfather   . Breast cancer Maternal Aunt 55    Social History   Tobacco Use  . Smoking status: Never Smoker  . Smokeless tobacco: Never Used  Substance Use Topics  . Alcohol use: Yes    Comment: social  . Drug use: No    Patient Active Problem List   Diagnosis Date Noted  . Menorrhagia with regular cycle 03/09/2017    Home Medications:    Current Meds  Medication Sig  . loratadine (CLARITIN) 10 MG tablet Take 10 mg by mouth daily.    Allergies:   Vicodin [hydrocodone-acetaminophen] and Tape  Review of Systems (ROS): Review of Systems  Constitutional: Positive for  chills and fatigue. Negative for fever.  HENT: Positive for congestion and sore throat. Negative for ear pain, postnasal drip, rhinorrhea, sinus pressure, sinus pain, sneezing and trouble swallowing.   Eyes: Negative for pain, discharge and redness.  Respiratory: Negative for cough, chest tightness and shortness of breath.   Cardiovascular: Negative for chest pain and palpitations.  Gastrointestinal: Positive for nausea. Negative for abdominal pain, diarrhea and  vomiting.  Musculoskeletal: Positive for myalgias. Negative for arthralgias, back pain and neck pain.  Skin: Negative for color change, pallor and rash.  Neurological: Positive for headaches. Negative for dizziness, syncope and weakness.  Hematological: Negative for adenopathy.     Vital Signs: Today's Vitals   11/14/19 1026 11/14/19 1027 11/14/19 1125  BP:  112/77   Pulse:  86   Resp:  18   Temp:  98.4 F (36.9 C)   TempSrc:  Oral   SpO2:  98%   Weight:  172 lb (78 kg)   Height:  5\' 5"  (1.651 m)   PainSc: 6   6     Physical Exam: Physical Exam  Constitutional: She is oriented to person, place, and time and well-developed, well-nourished, and in no distress.  HENT:  Head: Normocephalic and atraumatic.  Nose: Nose normal.  Mouth/Throat: Uvula is midline and mucous membranes are normal. Posterior oropharyngeal erythema present. No oropharyngeal exudate or posterior oropharyngeal edema.  Eyes: Pupils are equal, round, and reactive to light.  Cardiovascular: Normal rate, regular rhythm, normal heart sounds and intact distal pulses.  Pulmonary/Chest: Effort normal and breath sounds normal.  No cough noted in clinic. No SOB or increased WOB. No distress. Able to speak in complete sentences without difficulties. SPO2 98% on RA.  Lymphadenopathy:    She has no cervical adenopathy.  Neurological: She is alert and oriented to person, place, and time. Gait normal.  Skin: Skin is warm and dry. No rash noted. She is not diaphoretic.  Psychiatric: Mood, memory, affect and judgment normal.  Nursing note and vitals reviewed.   Urgent Care Treatments / Results:   Orders Placed This Encounter  Procedures  . Influenza panel by PCR (type A & B)    LABS: PLEASE NOTE: all labs that were ordered this encounter are listed, however only abnormal results are displayed. Labs Reviewed  INFLUENZA PANEL BY PCR (TYPE A & B)    EKG: -None  RADIOLOGY: No results  found.  PROCEDURES: Procedures  MEDICATIONS RECEIVED THIS VISIT: Medications - No data to display  PERTINENT CLINICAL COURSE NOTES/UPDATES:   Initial Impression / Assessment and Plan / Urgent Care Course:  Pertinent labs & imaging results that were available during my care of the patient were personally reviewed by me and considered in my medical decision making (see lab/imaging section of note for values and interpretations).  Kelly Morgan is a 54 y.o. female who presents to [facility] today with complaints of Cough, Sore Throat, and Generalized Body Aches  Patient overall well appearing and in no acute distress today in clinic. Presenting symptoms (see HPI) and exam as documented above. She presents with upper respiratory symptoms that have persisted for about 9 days; worse x 4 days. She has tested negative for SARS-CoV-2 (novel coronavirus) on 11/12/2019. Patient received first SARS-CoV-2 vaccine on 11/09/2019. Patient seen by ENT and started on amoxicillin-clavulanate. She was sent here for influenza testing.   Influenza diagnotic test via PCR resulted (-) for both the influenza A and B Ag. Presenting symptoms consistent with acute URI.  Discussed supportive care measures at home during acute phase of illness. Patient to rest as much as possible. She was encouraged to ensure adequate hydration (water and ORS) to prevent dehydration and electrolyte derangements. Recommended warm salt water gargles, hard candies/lozenges, and hot tea with honey/lemon to help soothe the throat and reduce irritation. May use Tylenol and/or Ibuprofen as needed for pain/fever. Patient to fill the prescription for the amoxicillin-clavulanate that was provided to her by ENT.   Discussed follow up with primary care physician in 1 week for re-evaluation. I have reviewed the follow up and strict return precautions for any new or worsening symptoms. Patient is aware of symptoms that would be deemed  urgent/emergent, and would thus require further evaluation either here or in the emergency department. At the time of discharge, she verbalized understanding and consent with the discharge plan as it was reviewed with her. All questions were fielded by provider and/or clinic staff prior to patient discharge.    Final Clinical Impressions / Urgent Care Diagnoses:   Final diagnoses:  Upper respiratory tract infection, unspecified type    New Prescriptions:  [place] Controlled Substance Registry consulted? Not Applicable  No orders of the defined types were placed in this encounter.   Recommended Follow up Care:  Patient encouraged to follow up with the following provider within the specified time frame, or sooner as dictated by the severity of her symptoms. As always, she was instructed that for any urgent/emergent care needs, she should seek care either here or in the emergency department for more immediate evaluation.  Follow-up Information    PCP In 1 week.   Why: General reassessment of symptoms if not improving        NOTE: This note was prepared using Scientist, clinical (histocompatibility and immunogenetics) along with smaller Lobbyist. Despite my best ability to proofread, there is the potential that transcriptional errors may still occur from this process, and are completely unintentional.    Verlee Monte, NP 11/15/19 2335

## 2020-09-30 ENCOUNTER — Telehealth: Payer: Self-pay

## 2020-09-30 NOTE — Telephone Encounter (Signed)
Patient advised Dr. Jean Rosenthal is out of the office until tomorrow. Will notify when order has been placed.

## 2020-09-30 NOTE — Telephone Encounter (Signed)
Patient would like to schedule mammogram in [place] prior to her AE w/[facility]J 10/23/20. [facility] needs order before patient can schedule. [phone]

## 2020-10-01 ENCOUNTER — Other Ambulatory Visit: Payer: Self-pay | Admitting: Obstetrics and Gynecology

## 2020-10-01 DIAGNOSIS — Z1231 Encounter for screening mammogram for malignant neoplasm of breast: Secondary | ICD-10-CM

## 2020-10-02 NOTE — Telephone Encounter (Signed)
Patient aware.

## 2020-10-22 ENCOUNTER — Ambulatory Visit
Admission: RE | Admit: 2020-10-22 | Discharge: 2020-10-22 | Disposition: A | Discharge status: Home / Self Care | Payer: BLUE CROSS/BLUE SHIELD | Source: Ambulatory Visit | Attending: Obstetrics and Gynecology | Admitting: Obstetrics and Gynecology

## 2020-10-22 ENCOUNTER — Other Ambulatory Visit: Payer: Self-pay

## 2020-10-22 ENCOUNTER — Other Ambulatory Visit: Payer: Self-pay | Admitting: Obstetrics and Gynecology

## 2020-10-22 DIAGNOSIS — Z1231 Encounter for screening mammogram for malignant neoplasm of breast: Secondary | ICD-10-CM | POA: Insufficient documentation

## 2020-10-22 DIAGNOSIS — R928 Other abnormal and inconclusive findings on diagnostic imaging of breast: Secondary | ICD-10-CM

## 2020-10-22 DIAGNOSIS — N6489 Other specified disorders of breast: Secondary | ICD-10-CM

## 2020-10-23 ENCOUNTER — Encounter: Payer: Self-pay | Admitting: Obstetrics and Gynecology

## 2020-10-23 ENCOUNTER — Ambulatory Visit (INDEPENDENT_AMBULATORY_CARE_PROVIDER_SITE_OTHER): Payer: BLUE CROSS/BLUE SHIELD | Admitting: Obstetrics and Gynecology

## 2020-10-23 VITALS — BP 114/70 | HR 76 | Ht 65.0 in | Wt 171.0 lb

## 2020-10-23 DIAGNOSIS — Z1339 Encounter for screening examination for other mental health and behavioral disorders: Secondary | ICD-10-CM

## 2020-10-23 DIAGNOSIS — Z1331 Encounter for screening for depression: Secondary | ICD-10-CM

## 2020-10-23 DIAGNOSIS — Z01419 Encounter for gynecological examination (general) (routine) without abnormal findings: Secondary | ICD-10-CM

## 2020-10-23 NOTE — Progress Notes (Signed)
Gynecology Annual Exam  PCP: Patient, No Pcp Per  Chief Complaint  Patient presents with  . Gynecologic Exam    Breast exam and pap smear. Patient complaining of sneezing, headaches, hot flashes and nasal congestion today.    History of Present Illness:  Ms. Kelly Morgan is a 55 y.o. [phone] who LMP was No LMP recorded. (Menstrual status: Perimenopausal)., presents today for her annual examination.  Her menses are irregular and spacing out. Her last spotting was September.   She does have vasomotor sx.   She is sexually active. She does not have vaginal dryness.  Last Pap: 1.5 years ago  Results were: no abnormalities /neg HPV DNA negative Hx of STDs: none  Last mammogram: yesterday  Results were: BiRads 0, needs additional imaging.  There is no FH of breast cancer. There is no FH of ovarian cancer. The patient does do self-breast exams.  Colonoscopy: never had DEXA: has not been screened for osteoporosis  Tobacco use: The patient denies current or previous tobacco use. Alcohol use: social drinker Exercise: not active  The patient wears seatbelts: yes.     Past Medical History:  Diagnosis Date  . Abnormal mammogram 2012  . Breast mass in female    left  . Family history of bladder cancer   . Family history of breast cancer   . Family history of colon cancer   . H/O seasonal allergies   . History of mammogram 02/01/2016   BIRAD 1  . History of Papanicolaou smear of cervix 02/01/2016   Neg    Past Surgical History:  Procedure Laterality Date  . DILATION AND CURETTAGE OF UTERUS    . KNEE SURGERY      Prior to Admission medications   Medication Sig Start Date End Date Taking? Authorizing Provider  loratadine (CLARITIN) 10 MG tablet Take 10 mg by mouth daily.   Yes [provider]    Allergies  Allergen Reactions  . Hydrocodone-Acetaminophen Itching  . Vicodin [Hydrocodone-Acetaminophen]   . Other Rash  . Tape Rash    Gynecologic  History:  No LMP recorded. (Menstrual status: Perimenopausal).  Obstetric History: [id number]  Family History  Problem Relation Age of Onset  . Bladder Cancer Father   . Heart disease Father   . Stroke Father   . Diabetes Mother   . Heart disease Mother   . Hypertension Mother   . Hypertension Brother   . Colon cancer Maternal Grandmother   . Heart disease Maternal Grandmother   . Heart disease Maternal Grandfather   . Heart disease Paternal Grandfather   . Breast cancer Maternal Aunt 20    Social History   Socioeconomic History  . Marital status: Married    Spouse name: Not on file  . Number of children: Not on file  . Years of education: Not on file  . Highest education level: Not on file  Occupational History  . Not on file  Tobacco Use  . Smoking status: Never Smoker  . Smokeless tobacco: Never Used  Vaping Use  . Vaping Use: Never used  Substance and Sexual Activity  . Alcohol use: Yes    Comment: social  . Drug use: No  . Sexual activity: Yes    Birth control/protection: None  Other Topics Concern  . Not on file  Social History Narrative  . Not on file   Social Determinants of Health   Financial Resource Strain: Not on file  Food Insecurity: Not on file  Transportation  Needs: Not on file  Physical Activity: Not on file  Stress: Not on file  Social Connections: Not on file  Intimate Partner Violence: Not on file    Review of Systems  Constitutional: Negative.   HENT: Negative.   Eyes: Negative.   Respiratory: Negative.   Cardiovascular: Negative.   Gastrointestinal: Negative.   Genitourinary: Negative.   Musculoskeletal: Negative.   Skin: Negative.   Neurological: Negative.   Psychiatric/Behavioral: Negative.      Physical Exam BP 114/70   Pulse 76   Ht 5\' 5"  (1.651 m)   Wt 171 lb (77.6 kg)   SpO2 98%   BMI 28.46 kg/m   OBGyn Exam  Female chaperone present for pelvic and breast  portions of the physical exam  Results: AUDIT  Questionnaire (screen for alcoholism): 1 PHQ-9: 10  Assessment: 55 y.o. 46 female here for routine gynecologic examination.  Plan: Problem List Items Addressed This Visit   None   Visit Diagnoses    Women's annual routine gynecological examination    -  Primary   Screening for depression       Screening for alcoholism          Screening: -- Blood pressure screen normal -- Colonoscopy - Strongly recommend -- Mammogram - had yesterday. BiRads 0.  Getting follow up imaging soon -- Weight screening: normal -- Depression screening negative (PHQ-9) -- Nutrition: normal -- cholesterol screening: per PCP -- osteoporosis screening: not due -- tobacco screening: not using -- alcohol screening: AUDIT questionnaire indicates low-risk usage. -- family history of breast cancer screening: done. not at high risk. -- no evidence of domestic violence or intimate partner violence. -- STD screening: gonorrhea/chlamydia NAAT not collected per patient request. -- pap smear not collected per ASCCP guidelines -- flu vaccine has not received. -- HPV vaccination series: not [id number], MD 10/23/2020 11:30 AM

## 2020-11-02 ENCOUNTER — Other Ambulatory Visit: Payer: BLUE CROSS/BLUE SHIELD

## 2020-11-02 ENCOUNTER — Ambulatory Visit: Payer: BLUE CROSS/BLUE SHIELD

## 2020-11-12 ENCOUNTER — Ambulatory Visit
Admission: RE | Admit: 2020-11-12 | Discharge: 2020-11-12 | Disposition: A | Discharge status: Home / Self Care | Payer: BLUE CROSS/BLUE SHIELD | Source: Ambulatory Visit | Attending: Obstetrics and Gynecology | Admitting: Obstetrics and Gynecology

## 2020-11-12 ENCOUNTER — Other Ambulatory Visit: Payer: Self-pay

## 2020-11-12 DIAGNOSIS — R928 Other abnormal and inconclusive findings on diagnostic imaging of breast: Secondary | ICD-10-CM

## 2020-11-12 DIAGNOSIS — N6489 Other specified disorders of breast: Secondary | ICD-10-CM

## 2022-05-24 IMAGING — MG DIGITAL DIAGNOSTIC BILAT W/ TOMO W/ CAD
8 series · 9 of 24 positions shown · non-contrast
Comparison: Previous exam(s).

CLINICAL DATA: Possible small asymmetry in each breast at recent
screening mammography.

EXAM:
DIGITAL DIAGNOSTIC BILATERAL MAMMOGRAM WITH TOMO AND CAD
TECHNIQUE: Bilateral digital diagnostic mammography and breast tomosynthesis
was performed. Digital images of the breasts were evaluated with
computer-aided detection.

[R MLO synth-2D]
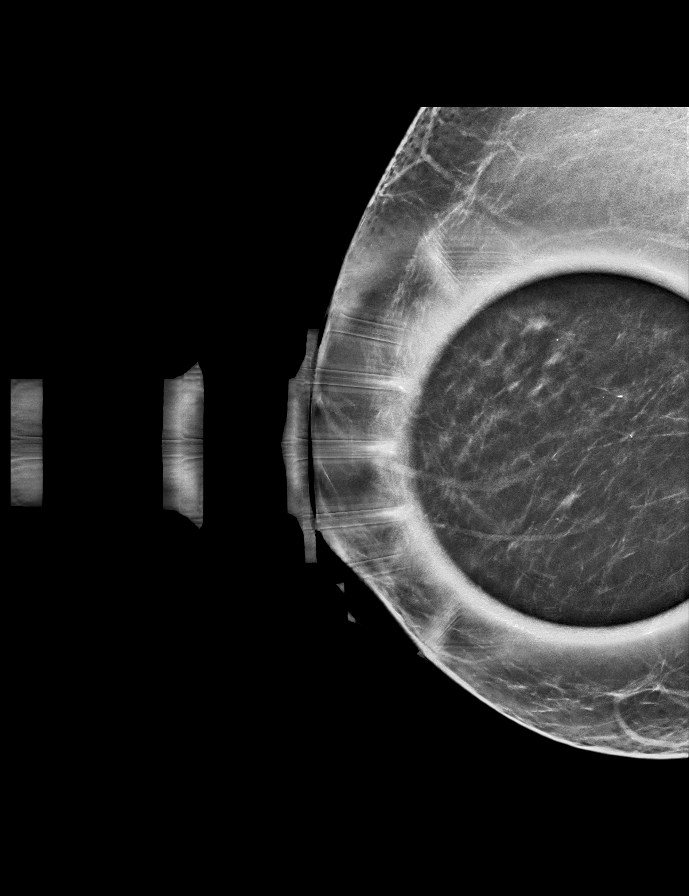

[L MLO synth-2D]
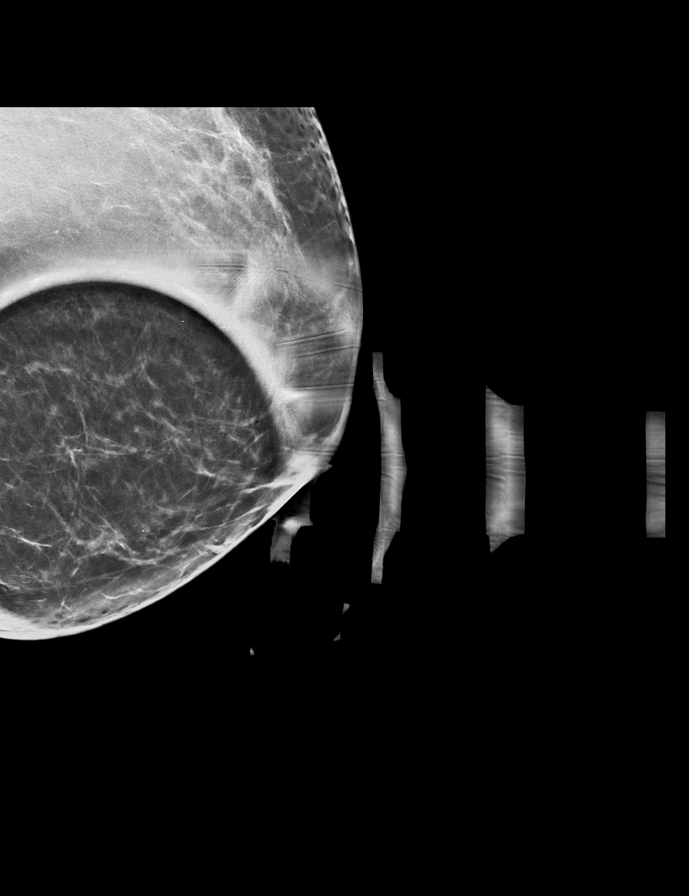

[R ML synth-2D]
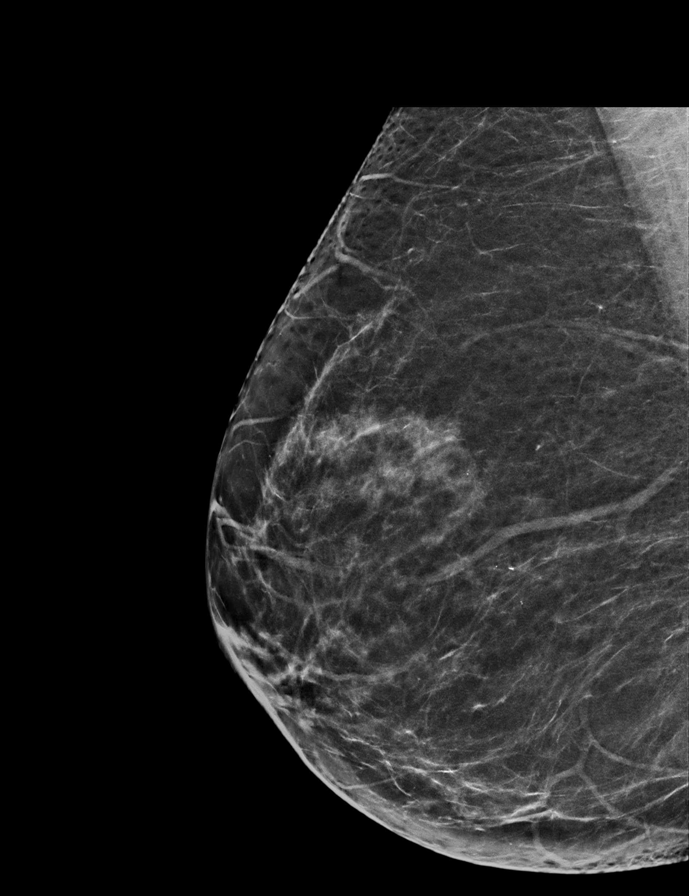

[L CC synth-2D]
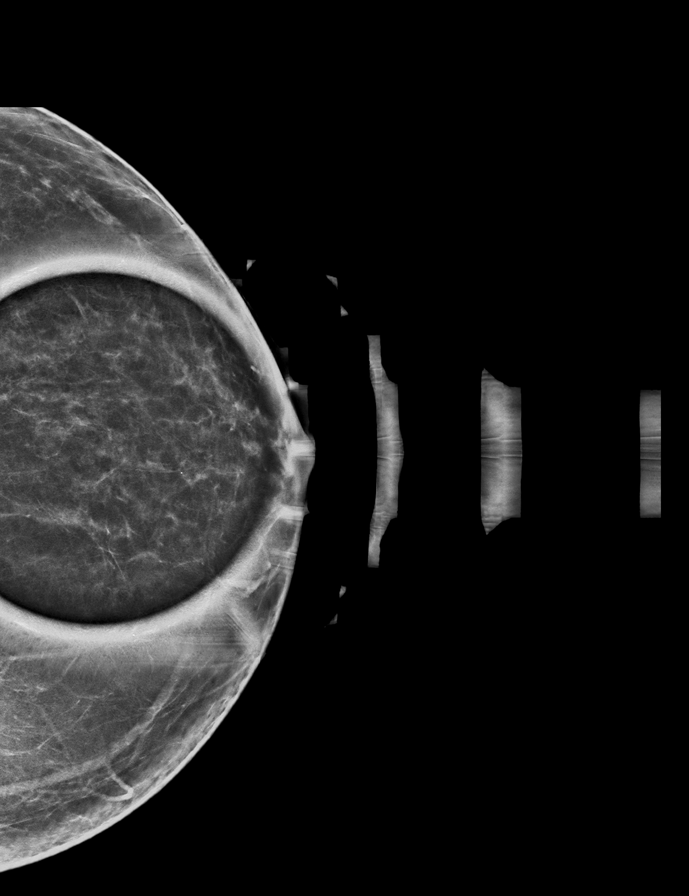

[L MLO tomo · 2 of 61 frames shown]
[frame 20/61]
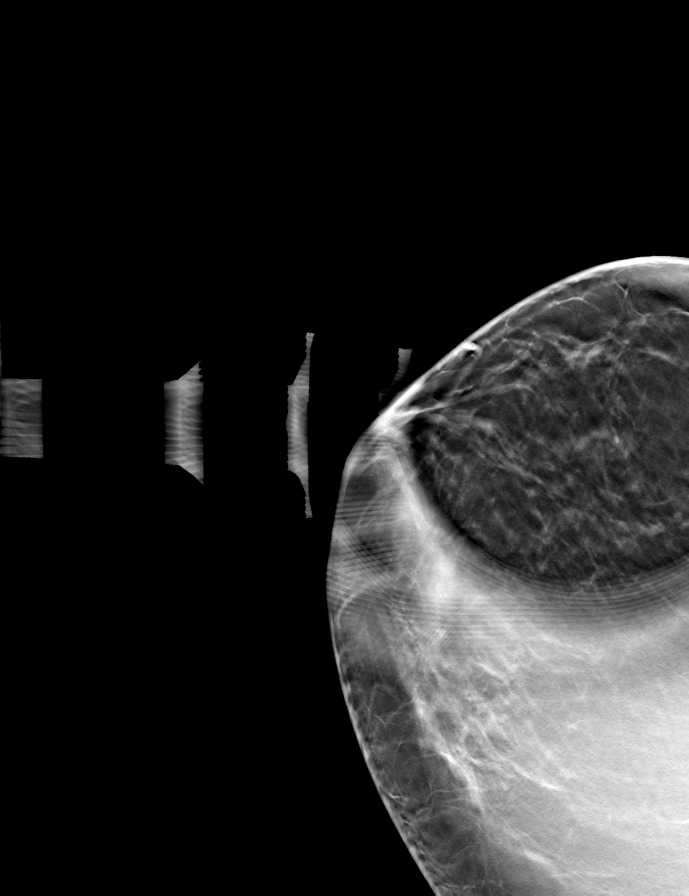
[frame 31/61]
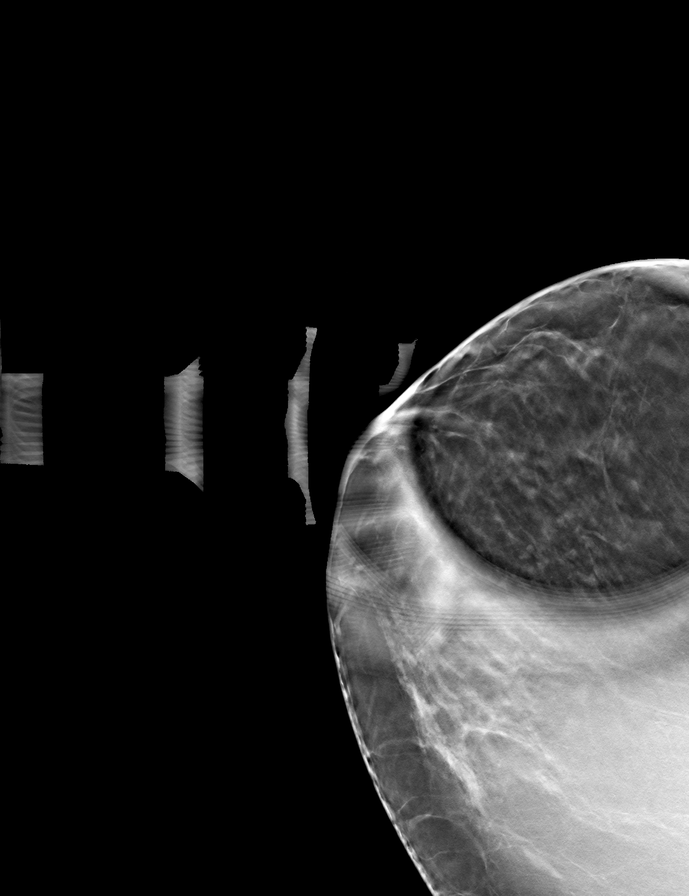

[R MLO tomo · tomo slice 32/63.0]
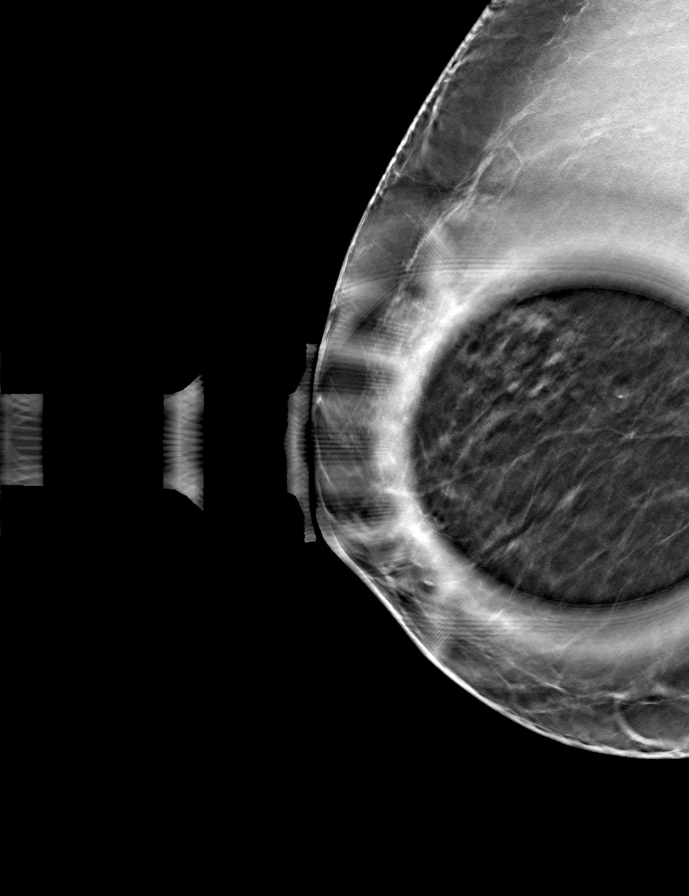

[R ML tomo · tomo slice 35/70.0]
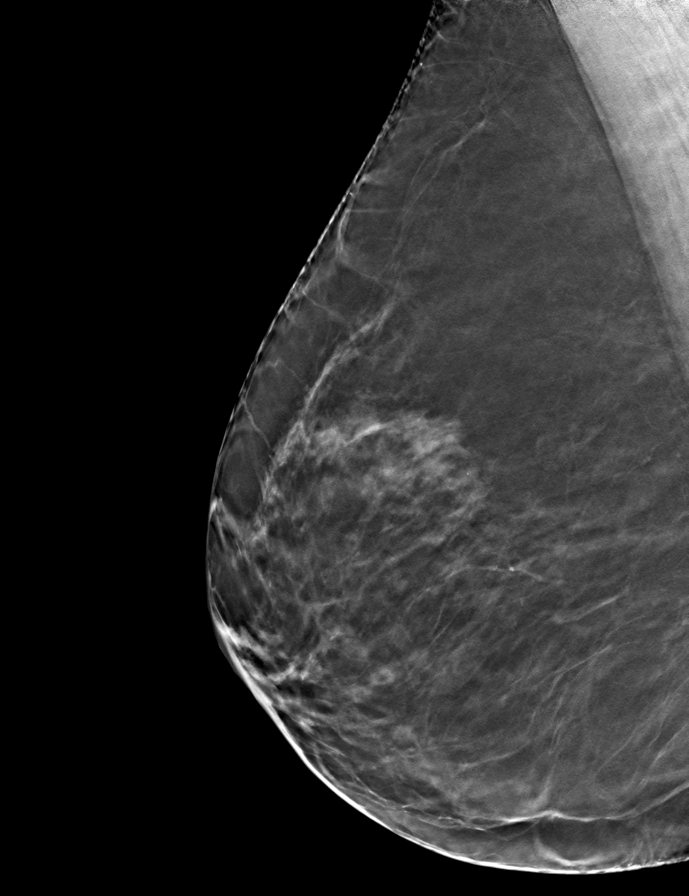

[L CC tomo · tomo slice 29/58.0]
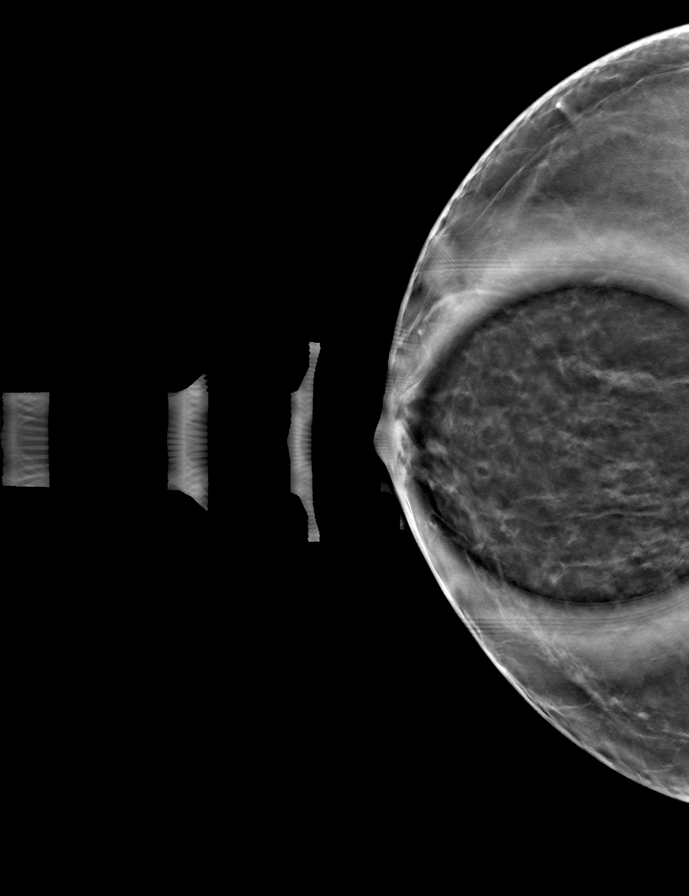

[9 of 24 positions shown; findings below may reference images not displayed]

ACR Breast Density Category b: There are scattered areas of
fibroglandular density.
FINDINGS: Normal appearing fibroglandular tissue at the locations of the
recently suspected asymmetries. No evidence of malignancy in either
breast.
IMPRESSION: No evidence of malignancy. The recently suspected bilateral breast
asymmetries were areas of close apposition of normal breast tissue.

RECOMMENDATION:
Bilateral screening mammogram in 1 year.

I have discussed the findings and recommendations with the patient.
If applicable, a reminder letter will be sent to the patient
regarding the next appointment.

BI-RADS CATEGORY  1: Negative.
